# Patient Record
Sex: Male | Born: 1990 | Race: White | Hispanic: No | Marital: Single | Smoking: Current some day smoker
Health system: Southern US, Community
[De-identification: ages and names within clinical notes are randomized; demographics above are authoritative.]

---
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
## 2012-04-16 IMAGING — RF DG CHOLANGIOGRAM OPERATIVE
1 series · 4 of 4 positions shown · non-contrast
Comparison: Abdominal CT 01/22/2011.

CLINICAL DATA: Laparoscopic cholecystectomy.  Question retained
calculus.

INTRAOPERATIVE CHOLANGIOGRAM
TECHNIQUE: Cholangiographic images from the C-arm fluoroscopic
device were submitted for interpretation post-operatively.  Please
see the procedural report for the amount of contrast and the
fluoroscopy time utilized.

[Series 1: run · 4 of 71 frames shown]
[frame 11/71]
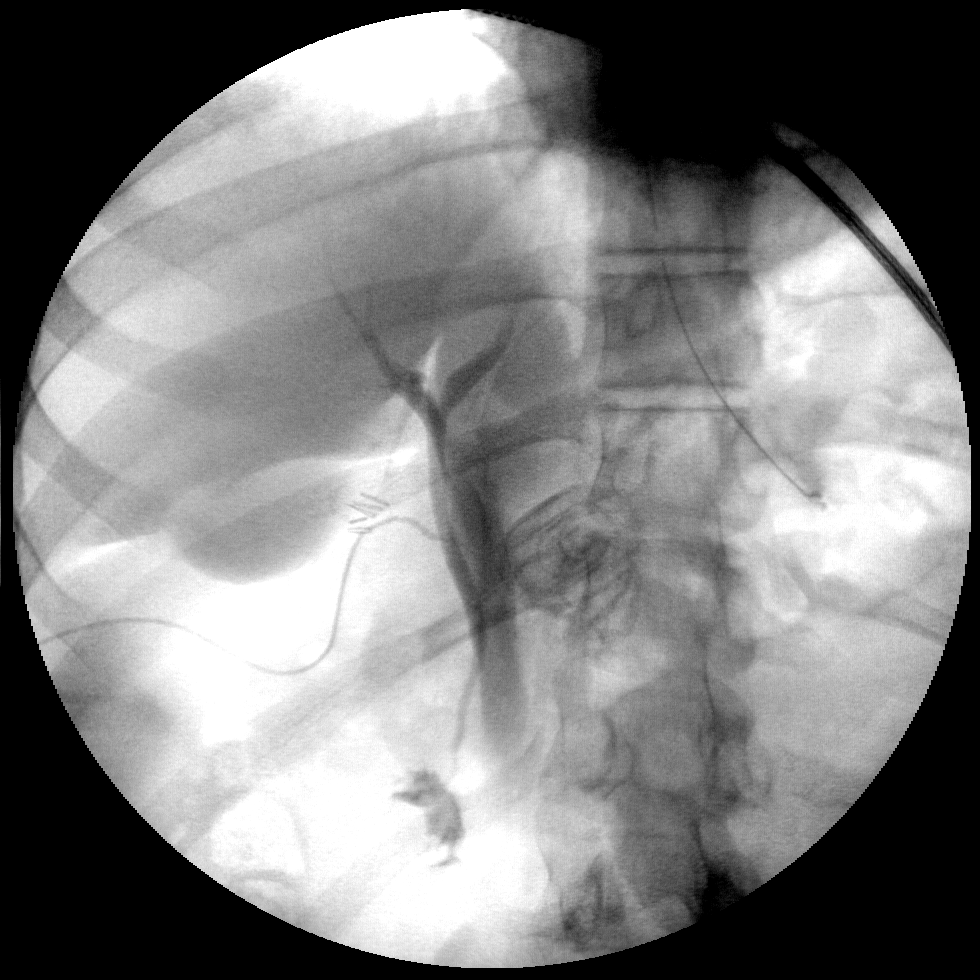
[frame 36/71]
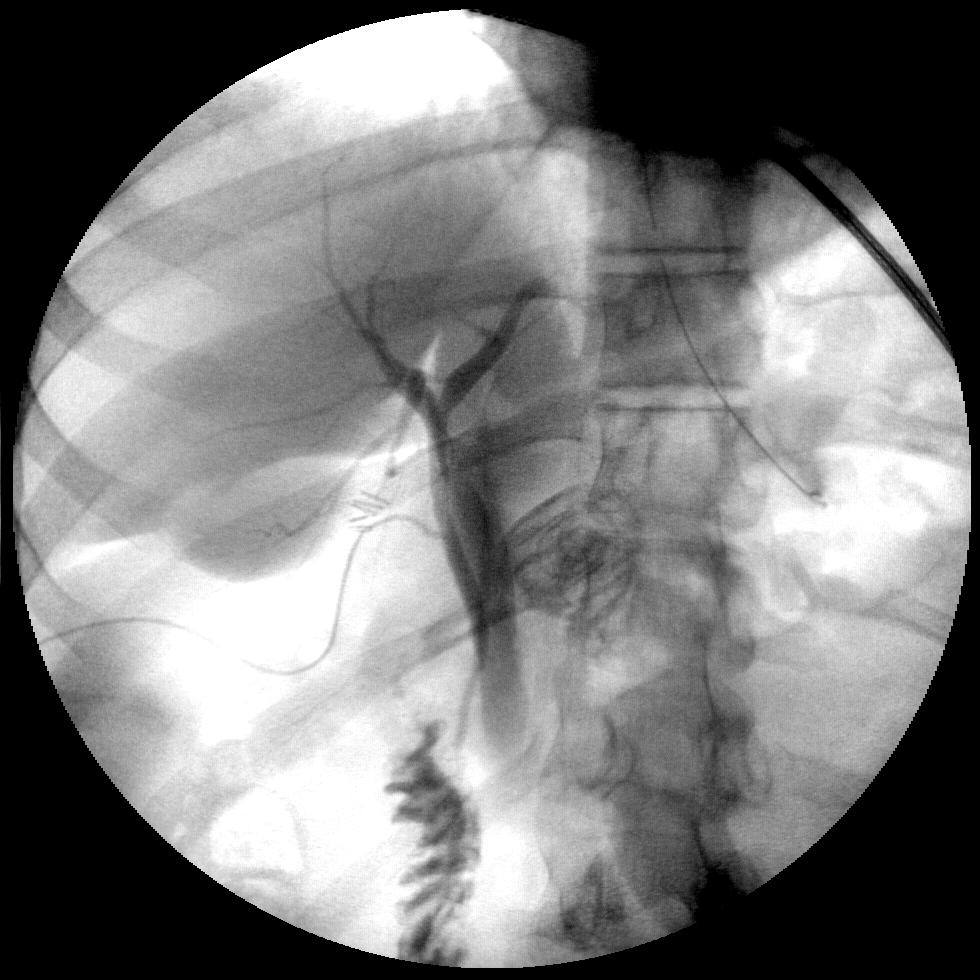
[frame 37/71]
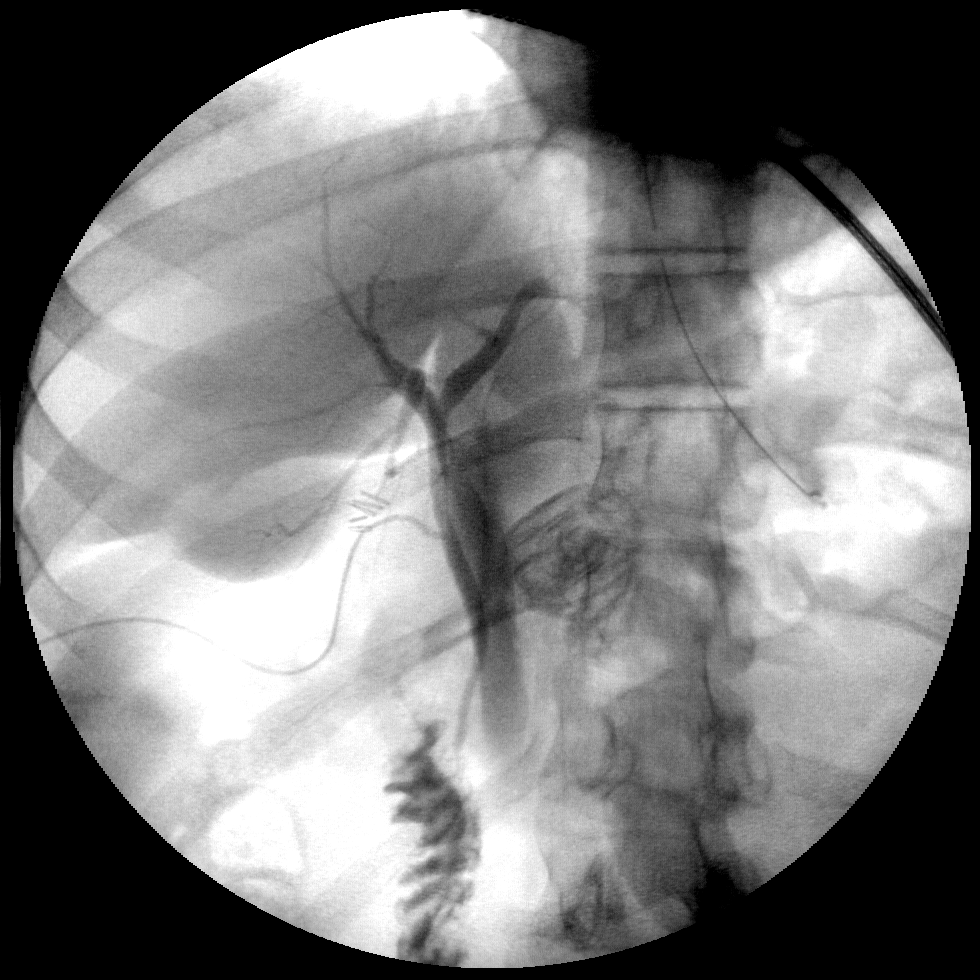
[frame 61/71]
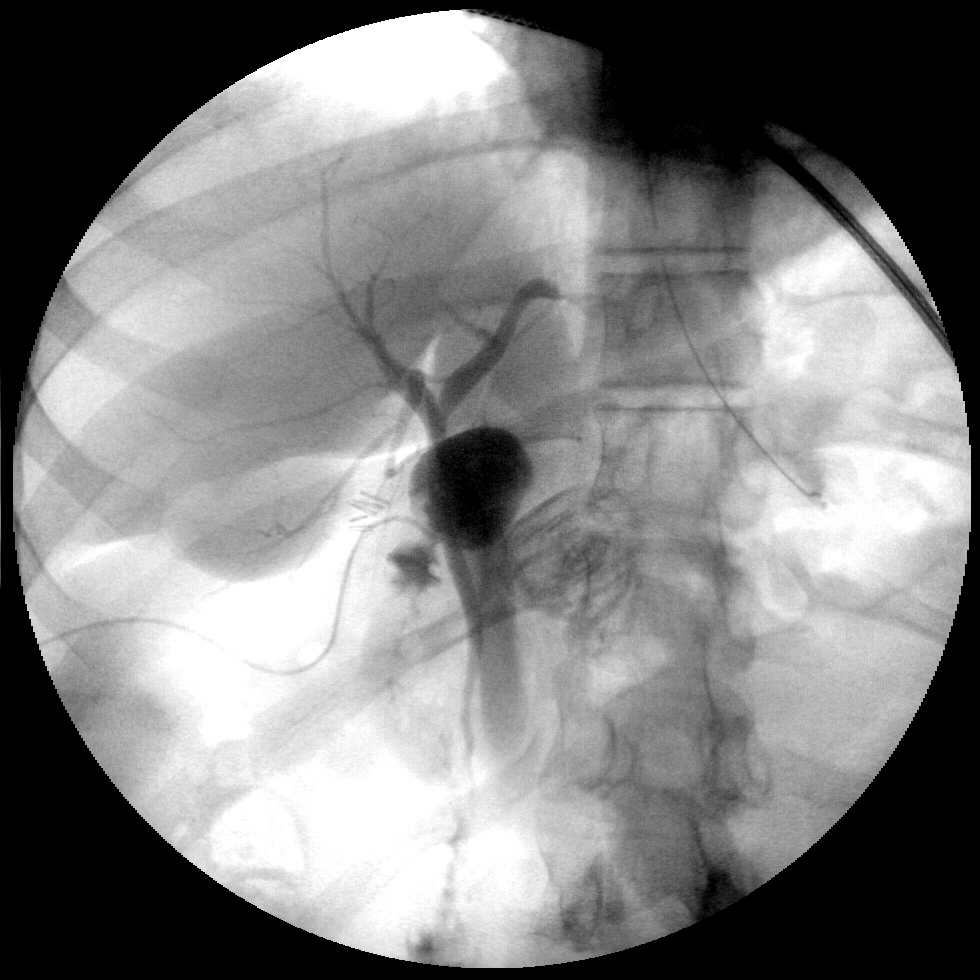

[4 of 4 positions shown; findings below may reference images not displayed]

FINDINGS: Initial images demonstrate vertical high density in the
right upper quadrant, overlying the common bile duct.  On injection
of the cystic duct remnant, the common bile duct is normally
opacified and appears normal in caliber.  This overlies the
preexisting density which does not change during this injection.
There is incomplete opacification of the intrahepatic biliary
system.  No extravasation or retained calculus is identified.
There is drainage into the duodenum.
IMPRESSION: 1.  No evidence of retained calculus, ductal obstruction or
definite extravasation.
2.  Vertical radiodensity in the right upper quadrant does not
change during the submitted injection. This may reflect leaked
contrast from a previous injection - correlate clinically.

## 2022-05-01 ENCOUNTER — Ambulatory Visit (INDEPENDENT_AMBULATORY_CARE_PROVIDER_SITE_OTHER): Payer: Medicare Other | Admitting: Psychiatry

## 2022-05-01 ENCOUNTER — Encounter (HOSPITAL_COMMUNITY): Payer: Self-pay | Admitting: Psychiatry

## 2022-05-01 VITALS — BP 126/84 | Ht 77.0 in | Wt 299.0 lb

## 2022-05-01 DIAGNOSIS — F3130 Bipolar disorder, current episode depressed, mild or moderate severity, unspecified: Secondary | ICD-10-CM

## 2022-05-01 DIAGNOSIS — F411 Generalized anxiety disorder: Secondary | ICD-10-CM | POA: Diagnosis not present

## 2022-05-01 DIAGNOSIS — F410 Panic disorder [episodic paroxysmal anxiety] without agoraphobia: Secondary | ICD-10-CM | POA: Diagnosis not present

## 2022-05-01 MED ORDER — CLONIDINE HCL 0.1 MG PO TABS: 0.1000 mg | ORAL_TABLET | Freq: Every day | ORAL | 1 refills | Status: DC

## 2022-05-01 MED ORDER — LURASIDONE HCL 40 MG PO TABS: ORAL_TABLET | ORAL | 1 refills | Status: DC

## 2022-05-01 NOTE — Progress Notes (Signed)
Psychiatric Initial Adult Assessment   Patient Identification: NASHID PELLUM MRN:  [id number] Date of Evaluation:  05/01/2022 Referral Source: primary care  Chief Complaint:   Chief Complaint  Patient presents with   Anxiety   Establish Care   Visit Diagnosis:    ICD-10-CM   1. Bipolar I disorder, most recent episode depressed (HCC)  F31.30     2. Panic attacks  F41.0     3. GAD (generalized anxiety disorder)  F41.1       History of Present Illness:  31 years old referred by primary care, [facility], now due to insurance reason has to change provider, on disability for bipolar, on multiple other meds before or tried, including caplyta, vraylar  Says has had depression in young age with extreme anxiety with panic attacks when going to school and later changed to home schooling starting 5th grade Has history of depression with crashing into tiredness, depressed mood, disturbed sleep apetite and diagnosed with depression could not function at work when tried different jobs and would get panic attacks  Xanax helps panic attacks cannot work Family is supportive Has history of manic symptoms in the past,irritable or  increase energy, hyped mood, spending money with distraction and racing thoughts with last 4  days or more then crash into depression  No side effects reported Has worries excessive in the past, seems worries are better with meds but gets panicky with around people, crowds  Stays at home  Feel balanced as of now with meds and home support uses 1 -2 beer 2 times a  week  No psychotic symptoms  Feels current meds keeping balance including lithium, lamictal, xanax, clonidine  Says clonidine helps with irritability and mood symptoms combine with other meds   Aggravating factor: finances, chronic mental health, anxiety Modifying factor: parents, walks  Duration since young age No past psych admission or  suicide attempt  Past Psychiatric History: depression, panic attacks,   Previous Psychotropic Medications: Yes  Caplyta, vraylar   Substance Abuse History in the last 12 months:  Yes.    Consequences of Substance Abuse: Takes beer over the weekend, discussed effects on depression and to abstain  Past Medical History: History reviewed. No pertinent past medical history. History reviewed. No pertinent surgical history.  Family Psychiatric History: GF brother, bipolar, sister possible bipolar  Family History: History reviewed. No pertinent family history.  Social History:   Social History   Socioeconomic History   Marital status: Single    Spouse name: Not on file   Number of children: Not on file   Years of education: Not on file   Highest education level: Not on file  Occupational History   Not on file  Tobacco Use   Smoking status: Never   Smokeless tobacco: Never  Vaping Use   Vaping Use: Some days   Substances: Nicotine, Flavoring  Substance and Sexual Activity   Alcohol use: Yes    Alcohol/week: 2.0 standard drinks of alcohol    Types: 2 Glasses of wine per week   Drug use: Never   Sexual activity: Yes  Other Topics Concern   Not on file  Social History Narrative   Not on file   Social Determinants of Health   Financial Resource Strain: Not on file  Food Insecurity: Not on file  Transportation Needs: Not on file  Physical Activity: Not on file  Stress: Not on file  Social Connections: Not  on file    Additional Social History: grew up with parents, 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 On disability for bipolar  Allergies:  No Known Allergies  Metabolic Disorder Labs: No results found for: "HGBA1C", "MPG" No results found for: "PROLACTIN" No results found for: "CHOL", "TRIG", "HDL", "CHOLHDL", "VLDL", "LDLCALC" No results found for: "TSH"  Therapeutic Level Labs: No results found for: "LITHIUM" No results found  for: "CBMZ" No results found for: "VALPROATE"  Current Medications: Current Outpatient Medications  Medication Sig Dispense Refill   albuterol (VENTOLIN HFA) 108 (90 Base) MCG/ACT inhaler SMARTSIG:1-2 Puff(s) By Mouth Every 6 Hours PRN     ALPRAZolam (XANAX) 1 MG tablet Take by mouth.     cloNIDine (CATAPRES) 0.1 MG tablet Take 0.1 mg by mouth at bedtime.     lamoTRIgine (LAMICTAL) 200 MG tablet TAKE 1 ORAL TABLET ONCE A DAY (IF OUT OF LAMICTAL MORE THAN 7 DAYS DO NOT RESTART CALL MD)     lithium 300 MG tablet Take by mouth.     lurasidone (LATUDA) 20 MG TABS tablet Take by mouth.     lurasidone (LATUDA) 40 MG TABS tablet TAKE 1 TABLET BY MOUTH EVERY DAY START IN 2 WEEKS WITH AT LEAST 350 CALORIE MEAL     meloxicam (MOBIC) 7.5 MG tablet Take by mouth.     metformin (FORTAMET) 1000 MG (OSM) 24 hr tablet TAKE 1 TABLET BY MOUTH EVERY DAY WITH A MEAL *MC NOT CALLED*     vitamin B-12 (CYANOCOBALAMIN) 500 MCG tablet Take 500 mcg by mouth daily.     No current facility-administered medications for this visit.    Psychiatric Specialty Exam: Review of Systems  Cardiovascular:  Negative for chest pain.  Psychiatric/Behavioral:  Negative for agitation and self-injury. The patient is nervous/anxious.     Blood pressure 126/84, height 6\' 5"  (1.956 m), weight 299 lb (135.6 kg).Body mass index is 35.46 kg/m.  General Appearance: Casual  Eye Contact:  Fair  Speech:  Clear and Coherent  Volume:  Decreased  Mood:   somewhat anxious  Affect:  Congruent  Thought Process:  Goal Directed  Orientation:  Full (Time, Place, and Person)  Thought Content:  Rumination  Suicidal Thoughts:  No  Homicidal Thoughts:  No  Memory:  Immediate;   Fair  Judgement:  Fair  Insight:  Shallow  Psychomotor Activity:  Decreased  Concentration:  Concentration: Fair  Recall:  of Knowledge:Fair  Language: Fair  Akathisia:   no involuntary movements  Handed:  Right  AIMS (if indicated):  no involuntary  movements  Assets:  Desire for Improvement Housing Social Support  ADL's:  Intact  Cognition: WNL  Sleep:  Fair   Screenings: PHQ2-9    Flowsheet Row [facility]/20[facility] [place]  PHQ-2 Total Score 2  PHQ-9 Total Score 13      Flowsheet Row Office Visit from 05/01/2022 in [facility] [place]  C-SSRS RISK CATEGORY No Risk       Assessment and Plan: as follows  Bipolar disorder depressed episode: doing fair with lithium, lamictal , clonidine for agitation. Also on latuda for bipolar  GAD: takes xanax , other meds name not give were tried, says different meds but xanax seems to keep help with anxiety and panic attacks  Panic attacks: says xanax hellps, other meds didn't help  Labs for lithium and TSH sent today to be done  Will also recommend counselling for anxiety, panic attacks  Collaboration of Care: Primary Care Provider AEB med note and referral reviewed  Patient/Guardian was advised Release of Information must be obtained prior to any record release in order to collaborate their care with an outside provider. Patient/Guardian was advised if they have not already done so to contact the registration department to sign all necessary forms in order for [place] to release information regarding their care.   Consent: Patient/Guardian gives verbal consent for treatment and assignment of benefits for services provided during this visit. Patient/Guardian expressed understanding and agreed to proceed.  FU 4 -5 weeks or earlier if needed Direct care time spent 45 min including chart review, documentation  Thresa Ross, MD 6/15/20232:08 PM

## 2022-05-02 LAB — TSH: TSH: 1.03 mIU/L (ref 0.40–4.50)

## 2022-05-02 LAB — LITHIUM LEVEL: Lithium Lvl: 0.5 mmol/L — ABNORMAL LOW (ref 0.6–1.2)

## 2022-05-23 ENCOUNTER — Other Ambulatory Visit (HOSPITAL_COMMUNITY): Payer: Self-pay | Admitting: Psychiatry

## 2022-06-10 ENCOUNTER — Ambulatory Visit (INDEPENDENT_AMBULATORY_CARE_PROVIDER_SITE_OTHER): Payer: Medicare Other | Admitting: Licensed Clinical Social Worker

## 2022-06-10 DIAGNOSIS — F410 Panic disorder [episodic paroxysmal anxiety] without agoraphobia: Secondary | ICD-10-CM | POA: Diagnosis not present

## 2022-06-10 DIAGNOSIS — F411 Generalized anxiety disorder: Secondary | ICD-10-CM | POA: Diagnosis not present

## 2022-06-10 DIAGNOSIS — F3130 Bipolar disorder, current episode depressed, mild or moderate severity, unspecified: Secondary | ICD-10-CM | POA: Diagnosis not present

## 2022-06-10 NOTE — Progress Notes (Signed)
Dad in session with patient to help provide collateral information for assessment   Comprehensive Clinical Assessment (CCA) Note  06/10/2022 [name] [id number]  Chief Complaint:  Chief Complaint  Patient presents with   Depression   Panic Attack   self-esteem   Anger   Visit Diagnosis: Bipolar disorder most recent episode depressed generalized anxiety disorder, panic attacks   CCA Biopsychosocial Intake/Chief Complaint:  Dad says he is on the autism spectrum, bipolar depressed, struggles a lot with depression. Currently along with depression he struggles with anger-there are things that trigger his anger that cause him to boil over internally. Dad can tell by talking to him angry does a good job controlling physically.  Harbors animosity toward people in his past he was bullied as a child. Family members that brought him out of his comfort zone ridiculed him where he being in Asperger's realm people talk things literally harbors hard feeling toward family members that make him feel uncomfortable. Dad doesn't know what his triggers are. Destroys his afternoon or day. Patient says wants to work on self-esteem. Last therapist Dr. Tennis Ship made him feel self-conscious about his weight. [facility] [address] in [place]  Current Symptoms/Problems: depression, anger,   Patient Reported Schizophrenia/Schizoaffective Diagnosis in Past: No   Strengths: kind person  Preferences: see above  Abilities: goes for walks watch sports likes to work on cars, playing sports   Type of Services Patient Feels are Needed: therapy, med management   Initial Clinical Notes/Concerns: Treatment history--patient diagnosed with bipolar disorder most recent episode depressed, panic attacks, generalized anxiety disorder. When patient turned 20 went to PCP "threw his hands up when kept coming back for depression" a couple attempts at employment panic attacks during the orientation process.  Put him on anti-depressant and mood stabilize [facility] started mental health treatment. He was under the care NP worked under the psychiatrist. He had 5 different therapists big on [name] being an independent thinking. He misinterpreted almost like a bit of rebellious state. One therapist who focused on when anxious when felt everything elevated taught him some breathing techniques served him well. Not a lot of productive therapy per Dad's opinion.  Went there for quite a while 9 or 10 years. Big reason here now Dad was forced to retire insurance transfer couldn't go to mood treatment center. Patient approved for disability he went on Medicaid.  After mood treatment center went to [facility]. Patient diagnosed with autism in 2019. Depression-long-term depression. Medical issues-he struggles with prediabetic has gained 100 lbs from propanolol. Family history-pat GF uncle-alcoholics extended family of wife uncle's were alcoholics. MH prevalent both sides of the family. pat great uncle and aunt, his great grandmother on father's side. Patient has sister diagnosed with bipolar Dad said don't know a lot about it she is high functioning. Wife's side 3 sisters including with wife she struggles with depression. They were all undiagnosed had a sister institutionalized briefly. She was addicted to drugs. great aunt-paternal major mental health a lot of depression, not being able to function won't leave the house.   Mental Health Symptoms Depression:   Change in energy/activity; Fatigue; Difficulty Concentrating; Hopelessness; Sleep (too much or little); Increase/decrease in appetite; Irritability; Tearfulness; Worthlessness (struggle with sleeping.  Loss of appetite sometimes skips breakfast and lunch. Weight fluctuates)   Duration of Depressive symptoms:  Greater than two weeks   Mania:   Irritability; Change in energy/activity; Recklessness; Racing thoughts; Increased Energy (Spending more  money,  last for 4 days then crashes into depression)   Anxiety:    Worrying; Difficulty concentrating; Fatigue; Irritability; Sleep; Tension; Restlessness (panic-break up in sweats tremors in hands can't speak clearly. Driving and pull off and tell Dad need for him to drive. At family function Dad can see him becoming disengaged go into a different room and tell Dad can't do this.)   Psychosis:   None   Duration of Psychotic symptoms: No data recorded  Trauma:   None   Obsessions:   -- (at times he can mess up a room with trash collects in room.)   Compulsions:   None   Inattention:   None   Hyperactivity/Impulsivity:   None   Oppositional/Defiant Behaviors:   None   Emotional Irregularity:   None   Other Mood/Personality Symptoms:  No data recorded   Mental Status Exam Appearance and self-care  Stature:   Tall   Weight:   Overweight   Clothing:   Casual   Grooming:   Normal   Cosmetic use:   None   Posture/gait:   Normal   Motor activity:   Not Remarkable   Sensorium  Attention:   Normal   Concentration:   Normal   Orientation:   X5   Recall/memory:   Normal   Affect and Mood  Affect:   Anxious   Mood:   Angry; Anxious; Euthymic; Euphoric   Relating  Eye contact:   Normal   Facial expression:   Responsive   Attitude toward examiner:   Cooperative   Thought and Language  Speech flow:  Normal   Thought content:   Appropriate to Mood and Circumstances   Preoccupation:   None   Hallucinations:   None   Organization:  No data recorded  [employer] of Knowledge:   Average   Intelligence:   Average   Abstraction:   Normal   Judgement:   Fair   Dance movement psychotherapist:   Realistic   Insight:   Fair   Decision Making:   Impulsive (manic triggered by depression, capable of making rational decisions Dad comfortable leaving him house couple of days would function fine if not depressed.)   Social  Functioning  Social Maturity:   Isolates (lives with parents. Dad retired Event organiser for [employer], mom retired Disabled work for [employer])   Social Judgement:   Normal   Stress  Stressors:   Surveyor, quantity (excellent driver but fear of accident forefront of mind. Doesn't like to be in car that doesn't have dashcam. Being in crowds sometimes panic if at restaurant, movie theater, family gathering.)   Coping Ability:   Overwhelmed   Skill Deficits:   -- (wants to work on self-esteem, for Dad controlling anger Dad doesn't like seeing him become nonfunctional if a notion comes up from the past so learning to control the anger)   Supports:   Family     Religion: Religion/Spirituality Are You A Religious Person?: Yes What is Your Religious Affiliation?: Baptist How Might This Affect Treatment?: n/a  Leisure/Recreation: Leisure / Recreation Do You Have Hobbies?: Yes Leisure and Hobbies: see above  Exercise/Diet: Exercise/Diet Do You Exercise?: Yes What Type of Exercise Do You Do?: Run/Walk How Many Times a Week Do You Exercise?: 1-3 times a week Have You Gained or Lost A Significant Amount of Weight in the Past Six Months?:  (fluctuates) Do You Follow a Special Diet?: No Do You Have Any Trouble Sleeping?: Yes Explanation of Sleeping Difficulties:  Mainly struggle with getting to sleep   CCA Employment/Education Employment/Work Situation: Employment / Work Situation Employment Situation: On disability Why is Patient on Disability: bipolar, autism spectrum made it to where (tried to work) he can't past the orientation of employment the anxiousness How Long has Patient Been on Disability: April 2003 Patient's Job has Been Impacted by Current Illness:  (n/a) What is the Longest Time Patient has Held a Job?: n/a Has Patient ever Been in the 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 Put him in main stream. Decided because of that best interest home school-) Patient's Education Has Been Impacted by Current Illness:  (cont-difficulty in school-not getting help from school system so decided to home school also received speech therapy)   CCA Family/Childhood History Family and Relationship History: Family history Marital status: Single Are you sexually active?: No What is your sexual orientation?: woman Has your sexual activity been affected by drugs, alcohol, medication, or emotional stress?: n/a Does patient have children?: No  Childhood History:  Childhood History By whom was/is the patient raised?: Both parents Additional childhood history information: a lot is blocked out by him. He was bullied. He was home schooled from fifth grade most related to anxiety. Description of patient's relationship with caregiver when they were a child: Dad says he was a dream to be around can't imagine having a better child until treated for things 4-5 months into treatment combination of medications and the therapy confusing him a bit. Being rebellious overstated testing boundaries. Never went through rebellious teen stage in contrast to sister. Patient's description of current relationship with people who raised him/her: get along issues mentioned gets involved when he is withdrawn. He gets angry and gets done for the day he has learned emotional awareness and learned not in a good head place and stays in room for hours. Wife says need to check on  him. Dad says knows not ok and wants time to himself. Sometimes mad and wants to put distance between parents sometimes a way of coping with life in general How were you disciplined when you got in trouble as a child/adolescent?: n/a Does patient have siblings?: Yes Number of Siblings: 1 Description of patient's current relationship with siblings: Kerin Salen sister-32 they go back and forth in how close they he always feels a closeness to her a protective type thing doesn't get along with brother-in-law puts a little distance in the relationship Did patient suffer any verbal/emotional/physical/sexual abuse as a child?: No Did patient suffer from severe childhood neglect?: No Has patient ever been sexually abused/assaulted/raped as an adolescent or adult?: No Was the patient ever a victim of a crime or a disaster?: No Witnessed domestic violence?: No Has patient been affected by domestic violence as an adult?: No  Child/Adolescent Assessment: n/a     CCA Substance Use Alcohol/Drug Use: Alcohol / Drug Use Pain Medications: n/a Prescriptions: see MAR Over the Counter: see MAR History of alcohol / drug use?: No history  of alcohol / drug abuse                         ASAM's:  Six Dimensions of Multidimensional Assessment  Dimension 1:  Acute Intoxication and/or Withdrawal Potential:      Dimension 2:  Biomedical Conditions and Complications:      Dimension 3:  Emotional, Behavioral, or Cognitive Conditions and Complications:     Dimension 4:  Readiness to Change:     Dimension 5:  Relapse, Continued use, or Continued Problem Potential:     Dimension 6:  Recovery/Living Environment:     ASAM Severity Score:    ASAM Recommended Level of Treatment:     Substance use Disorder (SUD)-n/a    Recommendations for Services/Supports/Treatments: Recommendations for Services/Supports/Treatments Recommendations For Services/Supports/Treatments: Medication Management, Individual  Therapy  DSM5 Diagnoses: Patient Active Problem List   Diagnosis Date Noted   RUQ PAIN 01/12/2009   GYNECOMASTIA 01/09/2009   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   FLANK PAIN 01/09/2009   EYE PAIN, RIGHT 07/28/2008   TINEA CRURIS 12/16/2007    Patient Centered Plan: Patient is on the following Treatment Plan(s):  Anxiety, Depression, and Low Self-Esteem, anger treatment plan completed at next treatment session   Referrals to Alternative Service(s): Referred to Alternative Service(s):   Place:   Date:   Time:    Referred to Alternative Service(s):   Place:   Date:   Time:    Referred to Alternative Service(s):   Place:   Date:   Time:    Referred to Alternative Service(s):   Place:   Date:   Time:      Collaboration of Care: Medication Management AEB review of Dr. Gilmore Laroche note  Patient/Guardian was advised Release of Information must be obtained prior to any record release in order to collaborate their care with an outside provider. Patient/Guardian was advised if they have not already done so to contact the registration department to sign all necessary forms in order for [place] to release information regarding their care.   Consent: Patient/Guardian gives verbal consent for treatment and assignment of benefits for services provided during this visit. Patient/Guardian expressed understanding and agreed to proceed.   Coolidge Breeze, LCSW

## 2022-06-12 ENCOUNTER — Encounter (HOSPITAL_COMMUNITY): Payer: Self-pay | Admitting: Psychiatry

## 2022-06-12 ENCOUNTER — Ambulatory Visit (INDEPENDENT_AMBULATORY_CARE_PROVIDER_SITE_OTHER): Payer: Medicare Other | Admitting: Psychiatry

## 2022-06-12 VITALS — BP 120/82 | Temp 98.5°F | Ht 77.0 in | Wt 298.0 lb

## 2022-06-12 DIAGNOSIS — F3130 Bipolar disorder, current episode depressed, mild or moderate severity, unspecified: Secondary | ICD-10-CM

## 2022-06-12 DIAGNOSIS — F410 Panic disorder [episodic paroxysmal anxiety] without agoraphobia: Secondary | ICD-10-CM | POA: Diagnosis not present

## 2022-06-12 DIAGNOSIS — F411 Generalized anxiety disorder: Secondary | ICD-10-CM | POA: Diagnosis not present

## 2022-06-12 MED ORDER — LAMOTRIGINE 200 MG PO TABS: ORAL_TABLET | ORAL | 1 refills | Status: DC

## 2022-06-12 MED ORDER — ALPRAZOLAM 0.5 MG PO TABS: 0.5000 mg | ORAL_TABLET | Freq: Every day | ORAL | 0 refills | Status: DC | PRN

## 2022-06-12 MED ORDER — LITHIUM CARBONATE 300 MG PO TABS: 900.0000 mg | ORAL_TABLET | Freq: Every evening | ORAL | 2 refills | Status: DC

## 2022-06-12 NOTE — Progress Notes (Signed)
[facility] Follow up visit  Patient Identification: [name] MRN:  [id number] Date of Evaluation:  06/12/2022 Referral Source: primary care  Chief Complaint:   Chief Complaint  Patient presents with   Follow-up   Anxiety   Visit Diagnosis:    ICD-10-CM   1. Bipolar I disorder, most recent episode depressed (HCC)  F31.30     2. Panic attacks  F41.0     3. GAD (generalized anxiety disorder)  F41.1       History of Present Illness:  31 years old referred by primary care, has been getting treatment at [facility] for bipolar and anxiety, now due to insurance reason has to change provider, on disability for bipolar, on multiple other meds before or tried, including caplyta, vraylar   Doing fait on meds, have stopped catapress. Xanax prn helps with anxiety and panic symptoms Mood fair on mood stablizers Lithium level is less then 0.5 TSH wnl  reviewed  No rash  Aggravating factor:finances, chronic mental health,  Modifying factor: parents, walks  Duration since young age No past psych admission or suicide attempt  Past Psychiatric History: depression, panic attacks,   Previous Psychotropic Medications: Yes  Caplyta, vraylar   Substance Abuse History in the last 12 months:  Yes.    Consequences of Substance Abuse: Takes beer over the weekend, discussed effects on depression and to abstain  Past Medical History: No past medical history on file. No past surgical history on file.  Family Psychiatric History: GF brother, bipolar, sister possible bipolar  Family History: No family history on file.  Social History:   Social History   Socioeconomic History   Marital status: Single    Spouse name: Not on file   Number of children: Not on file   Years of education: Not on file   Highest education level: Not on file  Occupational History   Not on file  Tobacco Use   Smoking status: Never   Smokeless tobacco: Never  Vaping Use   Vaping Use: Some days    Substances: Nicotine, Flavoring  Substance and Sexual Activity   Alcohol use: Yes    Alcohol/week: 2.0 standard drinks of alcohol    Types: 2 Glasses of wine per week   Drug use: Never   Sexual activity: Yes  Other Topics Concern   Not on file  Social History Narrative   Not on file   Social Determinants of Health   Financial Resource Strain: Not on file  Food Insecurity: Not on file  Transportation Needs: Not on file  Physical Activity: Not on file  Stress: Not on file  Social Connections: Not on file    Additional Social History: grew up with parents, 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 On disability for bipolar  Allergies:  No Known Allergies  Metabolic Disorder Labs: No results found for: "HGBA1C", "MPG" No results found for: "PROLACTIN" No results found for: "CHOL", "TRIG", "HDL", "CHOLHDL", "VLDL", "LDLCALC" Lab Results  Component Value Date   TSH 1.03 05/01/2022    Therapeutic Level Labs: Lab Results  Component Value Date   LITHIUM 0.5 (L) 05/01/2022   No results found for: "CBMZ" No results found for: "VALPROATE"  Current Medications: Current Outpatient Medications  Medication Sig Dispense Refill   albuterol (VENTOLIN HFA) 108 (90 Base) MCG/ACT inhaler SMARTSIG:1-2 Puff(s) By Mouth Every 6 Hours PRN     lurasidone (LATUDA) 40 MG TABS tablet TAKE 1 TABLET BY MOUTH EVERY DAY START  IN 2 WEEKS WITH AT LEAST 350 CALORIE MEAL 30 tablet 1   meloxicam (MOBIC) 7.5 MG tablet Take by mouth.     metformin (FORTAMET) 1000 MG (OSM) 24 hr tablet TAKE 1 TABLET BY MOUTH EVERY DAY WITH A MEAL *MC NOT CALLED*     vitamin B-12 (CYANOCOBALAMIN) 500 MCG tablet Take 500 mcg by mouth daily.     ALPRAZolam (XANAX) 0.5 MG tablet Take 1 tablet (0.5 mg total) by mouth daily as needed for anxiety. 30 tablet 0   lamoTRIgine (LAMICTAL) 200 MG tablet TAKE 1 ORAL TABLET ONCE A DAY (IF OUT OF LAMICTAL MORE THAN 7 DAYS DO NOT RESTART CALL MD) 30 tablet 1    lithium 300 MG tablet Take 3 tablets (900 mg total) by mouth at bedtime. 90 tablet 2   No current facility-administered medications for this visit.    Psychiatric Specialty Exam: Review of Systems  Cardiovascular:  Negative for chest pain.  Neurological:  Negative for tremors.  Psychiatric/Behavioral:  Negative for agitation and self-injury. The patient is nervous/anxious.     Blood pressure 120/82, temperature 98.5 F (36.9 C), height 6\' 5"  (1.956 m), weight 298 lb (135.2 kg).Body mass index is 35.34 kg/m.  General Appearance: Casual  Eye Contact:  Fair  Speech:  Clear and Coherent  Volume:  Decreased  Mood:  somewhat anxious  Affect:  Congruent  Thought Process:  Goal Directed  Orientation:  Full (Time, Place, and Person)  Thought Content:  Rumination  Suicidal Thoughts:  No  Homicidal Thoughts:  No  Memory:  Immediate;   Fair  Judgement:  Fair  Insight:  Shallow  Psychomotor Activity:  Decreased  Concentration:  Concentration: Fair  Recall:  of Knowledge:Fair  Language: Fair  Akathisia:   no involuntary movements  Handed:  Right  AIMS (if indicated):  no involuntary movements  Assets:  Desire for Improvement Housing Social Support  ADL's:  Intact  Cognition: WNL  Sleep:  Fair   Screenings: [employer] from 06/10/2022 in [facility] AT [place] Office Visit from 05/01/2022 in [facility] AT [place]  PHQ-2 Total Score 2 2  PHQ-9 Total Score 15 13      Flowsheet Row Office Visit from 06/12/2022 in [facility] AT [place] Counselor from 06/10/2022 in [facility] AT [place] Office Visit from 05/01/2022 in [facility] AT [place]  C-SSRS RISK CATEGORY Error: Q3, 4, or 5 should not be populated when Q2 is No Error: Q3, 4, or 5 should not be populated when Q2 is No No Risk       Assessment and  Plan: as follows  Prior documentation reviewed  Bipolar disorder depressed episode: doing fair continue lamictal, lithium, latuda Labs reviewed lithium level below 0.5   GAD: fluctuates work on breathing therapy continue xanax prn  Panic attacks:  sporadic, work on breathing techniques, and cotninue small dose prn  Fu 2 -17m Meds due were renewed Direct care time spent in office including face to face 20 minutes  1m, MD 7/27/202310:18 AM

## 2022-06-16 ENCOUNTER — Telehealth (HOSPITAL_COMMUNITY): Payer: Self-pay

## 2022-06-16 NOTE — Telephone Encounter (Signed)
[facility] sent a fax requesting a refill on Clonidine 0.1mg . Did you discontinue this medication, or do you want to refill it? It looks like he is stable on it in your note but in the medications it looks like you discontinued it. Please Petra Kuba

## 2022-06-16 NOTE — Telephone Encounter (Signed)
Ok, I will inform the pharmacy

## 2022-07-06 ENCOUNTER — Other Ambulatory Visit (HOSPITAL_COMMUNITY): Payer: Self-pay | Admitting: Psychiatry

## 2022-07-08 ENCOUNTER — Other Ambulatory Visit (HOSPITAL_COMMUNITY): Payer: Self-pay | Admitting: Psychiatry

## 2022-07-10 ENCOUNTER — Ambulatory Visit (HOSPITAL_COMMUNITY): Payer: Medicare Other | Admitting: Licensed Clinical Social Worker

## 2022-07-17 ENCOUNTER — Telehealth (HOSPITAL_COMMUNITY): Payer: Self-pay | Admitting: Psychiatry

## 2022-07-17 ENCOUNTER — Ambulatory Visit (HOSPITAL_COMMUNITY): Payer: Medicare Other | Admitting: Licensed Clinical Social Worker

## 2022-07-17 NOTE — Telephone Encounter (Signed)
Jury duty excuse letter written.

## 2022-07-23 ENCOUNTER — Other Ambulatory Visit (HOSPITAL_COMMUNITY): Payer: Self-pay | Admitting: *Deleted

## 2022-07-23 ENCOUNTER — Telehealth (HOSPITAL_COMMUNITY): Payer: Self-pay | Admitting: *Deleted

## 2022-07-23 MED ORDER — CLONIDINE HCL 0.1 MG PO TABS: 0.1000 mg | ORAL_TABLET | Freq: Every day | ORAL | 1 refills | Status: DC

## 2022-07-23 NOTE — Telephone Encounter (Signed)
[facility] -- cloNIDine (CATAPRES) 0.1 MG tablet  Last Fill Date: 06/24/22

## 2022-07-23 NOTE — Telephone Encounter (Signed)
Next Appt 09/11/82

## 2022-07-23 NOTE — Telephone Encounter (Signed)
Rx NOTIFIED

## 2022-08-19 ENCOUNTER — Ambulatory Visit (HOSPITAL_COMMUNITY): Payer: Medicare Other | Admitting: Licensed Clinical Social Worker

## 2022-09-08 ENCOUNTER — Ambulatory Visit (INDEPENDENT_AMBULATORY_CARE_PROVIDER_SITE_OTHER): Payer: Medicare Other | Admitting: Licensed Clinical Social Worker

## 2022-09-08 ENCOUNTER — Encounter (HOSPITAL_COMMUNITY): Payer: Self-pay

## 2022-09-08 DIAGNOSIS — F410 Panic disorder [episodic paroxysmal anxiety] without agoraphobia: Secondary | ICD-10-CM

## 2022-09-08 DIAGNOSIS — F411 Generalized anxiety disorder: Secondary | ICD-10-CM | POA: Diagnosis not present

## 2022-09-08 DIAGNOSIS — F3130 Bipolar disorder, current episode depressed, mild or moderate severity, unspecified: Secondary | ICD-10-CM | POA: Diagnosis not present

## 2022-09-08 NOTE — Progress Notes (Signed)
THERAPIST PROGRESS NOTE  Session Time: 2:00 PM to 2:45 PM  Participation Level: Active  Behavioral Response: CasualAlert appropriate  Type of Therapy: Individual Therapy  Treatment Goals addressed: Work on self-esteem, anxiety, depression, anger management, coping  ProgressTowards Goals: Initial  Interventions: Solution Focused, Strength-based, Supportive, and Other: Strengthening self-esteem  Summary: [name] is a 31 y.o. male who presents with patient said disability worker wants him to do therapy with med management.  Patient thinks will help last couple months he struggled with self-esteem.  Reviewed how he has been things he has been doing gets out to help his father deliver groceries.  Likes to get out previous therapist encouraged him to be independent this is a way for him to work on that.  Did he also was helping people who cannot get out in the community to get groceries.  He also helps his mom who has health issues. 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  Reviewed beginning of handout on self-esteem titled "8 steps to improving your self-esteem" therapist utilized handout to reinforce we all have unconditional worth.  Noted patient can be his worst critic and talked about being more mindful recognizing and becoming aware of negative self talk as we become more aware we begin to distance her cells from the feelings that brings on.  Without this awareness we can easily fall into the trap of believing are self-limiting talk and it is 1 expert says do not believe everything you think thoughts are just that thoughts not facts.  Talked about changing the story we will have a narrative her story we created about ourselves that shapes are off perceptions upon which are core self images base if we want to change the story we have to understand where it came from and where we receive the messages we tell ourselves whose voices are we internalizing noted for patient being  picked on for being heavy, previous therapist who picked on him for this.  Talked about one of his goals is to lose weight talked about tracking is a helpful way to do this tracking as well as exercise and water intake. Patient says this has been a goal for him for years to lose weight.  Does regular walking and actually cold weather is better as he is body can get overheated.  Also talked about how negative thoughts can be repeated so many times he start to believe there truth.  Some thoughts are learn which means that they can be on learned.  What does he wish he believes about himself repeat these phrases every day.  It has been demonstrated that writing down positive affirmations can lessen symptoms of depression and that large number of written positive statements are correlated with greater improvement. to lose weight for years.  Completed treatment plan and patient gave consent to complete in session verbally.  Therapist also mentioned panic and working on this as well think he is breathing slower noting it is a false alarm that nothing is happening that is dangerous and patient seems to be aware of this.  Therapist provided space and support for patient to talk about thoughts and feelings in session Suicidal/Homicidal: No  Plan: Return again in 2 weeks.2.  Continue with self-esteem workbook look at resources such as anxiety book, mood book work on panic  Diagnosis: Bipolar disorder most recent episode depressed, Annick attacks, analyzed anxiety disorder  Collaboration of Care: Medication Management AEB review of Dr. Gilmore Laroche last note  Patient/Guardian was  advised Release of Information must be obtained prior to any record release in order to collaborate their care with an outside provider. Patient/Guardian was advised if they have not already done so to contact the registration department to sign all necessary forms in order for [place] to release information regarding their care.   Consent:  Patient/Guardian gives verbal consent for treatment and assignment of benefits for services provided during this visit. Patient/Guardian expressed understanding and agreed to proceed.   Cordella Register, LCSW 09/08/2022

## 2022-09-08 NOTE — Plan of Care (Signed)
  Problem: Depression CCP Problem  1  Goal:  improve self-esteem Outcome: Not Progressing Goal: decrease anxiety Description: Patient feels like it slowed down past 2-3 months. Comes out of blue hyperventilating, can't control breathing.  Outcome: Not Progressing Goal: anger management Description: Family situations can be a trigger Outcome: Not Progressing Goal: LTG: Reduce frequency, intensity, and duration of depression symptoms as evidenced by: SSB input needed on appropriate metric Outcome: Not Progressing Goal: STG: [name] WILL IDENTIFY as needed COGNITIVE PATTERNS AND BELIEFS THAT SUPPORT DEPRESSION Outcome: Not Progressing

## 2022-09-11 ENCOUNTER — Encounter (HOSPITAL_COMMUNITY): Payer: Self-pay | Admitting: Psychiatry

## 2022-09-11 ENCOUNTER — Ambulatory Visit (INDEPENDENT_AMBULATORY_CARE_PROVIDER_SITE_OTHER): Payer: Medicare Other | Admitting: Psychiatry

## 2022-09-11 VITALS — BP 128/84 | HR 84 | Ht 77.0 in | Wt 304.0 lb

## 2022-09-11 DIAGNOSIS — F3130 Bipolar disorder, current episode depressed, mild or moderate severity, unspecified: Secondary | ICD-10-CM | POA: Diagnosis not present

## 2022-09-11 DIAGNOSIS — F410 Panic disorder [episodic paroxysmal anxiety] without agoraphobia: Secondary | ICD-10-CM

## 2022-09-11 DIAGNOSIS — F411 Generalized anxiety disorder: Secondary | ICD-10-CM | POA: Diagnosis not present

## 2022-09-11 NOTE — Progress Notes (Signed)
[place] Follow up visit  Patient Identification: [name] MRN:  [id number] Date of Evaluation:  09/11/2022 Referral Source: primary care  Chief Complaint:   No chief complaint on file. Follow up mood symptoms, bipolar review Visit Diagnosis:    ICD-10-CM   1. Bipolar I disorder, most recent episode depressed ([place])  F31.30     2. Panic attacks  F41.0     3. GAD (generalized anxiety disorder)  F41.1       History of Present Illness:  31 years old referred by primary care, [facility], now due to insurance reason has to change provider, on disability for bipolar, on multiple other meds before or tried, including caplyta, vraylar  Doing fair, spending time outside in the sun, depression better  Still worries, more so related to world events Denies worsening of panic attacks Xanax prn helps with that and is in therapy No rash   Aggravating factor:finances, world events, chronic mental health,  Modifying factor: parents,walks Severity better, sleeping better Duration since young age No past psych admission or suicide attempt  Past Psychiatric History: depression, panic attacks,   Previous Psychotropic Medications: Yes  Caplyta, vraylar   Substance Abuse History in the last 12 months:  Yes.    Consequences of Substance Abuse: Takes beer over the weekend, discussed effects on depression and to abstain  Past Medical History: History reviewed. No pertinent past medical history. History reviewed. No pertinent surgical history.  Family Psychiatric History: GF brother, bipolar, sister possible bipolar  Family History: History reviewed. No pertinent family history.  Social History:   Social History   Socioeconomic History   Marital status: Single    Spouse name: Not on file   Number of children: Not on file   Years of education: Not on file   Highest education level: Not on file  Occupational History   Not on  file  Tobacco Use   Smoking status: Never   Smokeless tobacco: Never  Vaping Use   Vaping Use: Some days   Substances: Nicotine, Flavoring  Substance and Sexual Activity   Alcohol use: Yes    Alcohol/week: 2.0 standard drinks of alcohol    Types: 2 Glasses of wine per week   Drug use: Never   Sexual activity: Yes  Other Topics Concern   Not on file  Social History Narrative   Not on file   Social Determinants of Health   Financial Resource Strain: Not on file  Food Insecurity: Not on file  Transportation Needs: Not on file  Physical Activity: Not on file  Stress: Not on file  Social Connections: Not on file    Additional Social History: grew up with parents, 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 On disability for bipolar  Allergies:  No Known Allergies  Metabolic Disorder Labs: No results found for: "HGBA1C", "MPG" No results found for: "PROLACTIN" No results found for: "CHOL", "TRIG", "HDL", "CHOLHDL", "VLDL", "LDLCALC" Lab Results  Component Value Date   TSH 1.03 05/01/2022    Therapeutic Level Labs: Lab Results  Component Value Date   LITHIUM 0.5 (L) 05/01/2022   No results found for: "CBMZ" No results found for: "VALPROATE"  Current Medications: Current Outpatient Medications  Medication Sig Dispense Refill   albuterol (VENTOLIN HFA) 108 (90 Base) MCG/ACT inhaler SMARTSIG:1-2 Puff(s) By Mouth Every 6 Hours PRN     ALPRAZolam (XANAX) 0.5 MG tablet Take 1 tablet (0.5  mg total) by mouth daily as needed for anxiety. 30 tablet 0   cloNIDine (CATAPRES) 0.1 MG tablet Take 1 tablet (0.1 mg total) by mouth daily. 30 tablet 1   lamoTRIgine (LAMICTAL) 200 MG tablet TAKE 1 ORAL TABLET ONCE A DAY (IF OUT OF LAMICTAL MORE THAN 7 DAYS DO NOT RESTART CALL MD) 30 tablet 1   lithium 300 MG tablet TAKE 3 TABLETS (900 MG TOTAL) BY MOUTH AT BEDTIME. 270 tablet 1   lurasidone (LATUDA) 40 MG TABS tablet TAKE 1 TABLET BY MOUTH EVERY DAY START IN 2  WEEKS WITH AT LEAST 350 CALORIE MEAL 30 tablet 1   meloxicam (MOBIC) 7.5 MG tablet Take by mouth.     metformin (FORTAMET) 1000 MG (OSM) 24 hr tablet TAKE 1 TABLET BY MOUTH EVERY DAY WITH A MEAL *MC NOT CALLED*     vitamin B-12 (CYANOCOBALAMIN) 500 MCG tablet Take 500 mcg by mouth daily.     No current facility-administered medications for this visit.    Psychiatric Specialty Exam: Review of Systems  Cardiovascular:  Negative for chest pain.  Neurological:  Negative for tremors.  Psychiatric/Behavioral:  Negative for agitation and self-injury.     Blood pressure 128/84, pulse 84, height 6\' 5"  (1.956 m), weight (!) 304 lb (137.9 kg), SpO2 98 %.Body mass index is 36.05 kg/m.  General Appearance: Casual  Eye Contact:  Fair  Speech:  Clear and Coherent  Volume:  Decreased  Mood: fair  Affect:  Congruent  Thought Process:  Goal Directed  Orientation:  Full (Time, Place, and Person)  Thought Content:  Rumination  Suicidal Thoughts:  No  Homicidal Thoughts:  No  Memory:  Immediate;   Fair  Judgement:  Fair  Insight:  Shallow  Psychomotor Activity:  Decreased  Concentration:  Concentration: Fair  Recall:  of Knowledge:Fair  Language: Fair  Akathisia:   no involuntary movements  Handed:  Right  AIMS (if indicated):  no involuntary movements  Assets:  Desire for Improvement Housing Social Support  ADL's:  Intact  Cognition: WNL  Sleep:  Fair   Screenings: [employer] from 06/10/2022 in [facility] AT [place] Office Visit from 05/01/2022 in [facility] AT [place]  PHQ-2 Total Score 2 2  PHQ-9 Total Score 15 13      Flowsheet Row Office Visit from 09/11/2022 in [facility] AT [place] Office Visit from 06/12/2022 in [facility] AT [place] Counselor from 06/10/2022 in [facility] AT [place]  C-SSRS RISK  CATEGORY No Risk Error: Q3, 4, or 5 should not be populated when Q2 is No Error: Q3, 4, or 5 should not be populated when Q2 is No       Assessment and Plan: as follows Prior documentation reviewed  Bipolar disorder depressed episode:  manageble, continue latuda, lamictal, lithium  No rash  GAD: ffluctuates, working with therapist to deal with it, continue prn xanax if needed. Plans to see counsellor more for anxiety and coping with it.  Panic attacks: sporadic, not worse, xanax helps will continue  Meds due were renewed Direct care time spent in office including face to face  15 - 20 min,   06/12/2022, MD 10/26/20233:55 PM

## 2022-09-22 ENCOUNTER — Ambulatory Visit (HOSPITAL_COMMUNITY): Payer: Medicare Other | Admitting: Licensed Clinical Social Worker

## 2022-09-22 NOTE — Progress Notes (Signed)
Patient scheduled for in person session and did not show

## 2022-10-06 ENCOUNTER — Ambulatory Visit (HOSPITAL_COMMUNITY): Payer: Medicare Other | Admitting: Licensed Clinical Social Worker

## 2022-10-06 NOTE — Progress Notes (Signed)
Patient did not show for appointment.   

## 2022-10-23 ENCOUNTER — Other Ambulatory Visit (HOSPITAL_COMMUNITY): Payer: Self-pay | Admitting: Psychiatry

## 2022-11-08 ENCOUNTER — Other Ambulatory Visit (HOSPITAL_COMMUNITY): Payer: Self-pay | Admitting: Psychiatry

## 2022-12-17 ENCOUNTER — Other Ambulatory Visit (HOSPITAL_COMMUNITY): Payer: Self-pay | Admitting: Psychiatry

## 2023-01-13 ENCOUNTER — Encounter (HOSPITAL_COMMUNITY): Payer: Self-pay | Admitting: Psychiatry

## 2023-01-13 ENCOUNTER — Ambulatory Visit (INDEPENDENT_AMBULATORY_CARE_PROVIDER_SITE_OTHER): Payer: Medicare Other | Admitting: Psychiatry

## 2023-01-13 VITALS — BP 132/86 | HR 100 | Temp 98.2°F | Ht 73.0 in | Wt 308.0 lb

## 2023-01-13 DIAGNOSIS — F410 Panic disorder [episodic paroxysmal anxiety] without agoraphobia: Secondary | ICD-10-CM | POA: Diagnosis not present

## 2023-01-13 DIAGNOSIS — F3130 Bipolar disorder, current episode depressed, mild or moderate severity, unspecified: Secondary | ICD-10-CM

## 2023-01-13 DIAGNOSIS — F411 Generalized anxiety disorder: Secondary | ICD-10-CM | POA: Diagnosis not present

## 2023-01-13 MED ORDER — BUPROPION HCL ER (SR) 100 MG PO TB12: 100.0000 mg | ORAL_TABLET | Freq: Every day | ORAL | 1 refills | Status: DC

## 2023-01-13 MED ORDER — LURASIDONE HCL 40 MG PO TABS: ORAL_TABLET | ORAL | 1 refills | Status: DC

## 2023-01-13 MED ORDER — CLONIDINE HCL 0.1 MG PO TABS: 0.1000 mg | ORAL_TABLET | Freq: Every day | ORAL | 1 refills | Status: DC

## 2023-01-13 MED ORDER — LAMOTRIGINE 200 MG PO TABS: ORAL_TABLET | ORAL | 0 refills | Status: DC

## 2023-01-13 NOTE — Progress Notes (Signed)
[place] Follow up visit  Patient Identification: [name] MRN:  [id number] Date of Evaluation:  01/13/2023 Referral Source: primary care  Chief Complaint:   No chief complaint on file. Follow up mood symptoms, bipolar review Visit Diagnosis:    ICD-10-CM   1. Bipolar I disorder, most recent episode depressed ([place])  F31.30     2. Panic attacks  F41.0     3. GAD (generalized anxiety disorder)  F41.1       History of Present Illness:  32 years old referred by primary care, [facility], now due to insurance reason has to change provider, on disability for bipolar, on multiple other meds before or tried, including caplyta, vraylar  Patient is here with his dad today apparently has had depression over the Christmas holidays and winter has been having some hopelessness despair and loneliness feeling.  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  Worries are there he feels a motivated irregular sleep cycle dad also mentioned that he sometimes get moody and appears to be depressed at home Different medication withdrawal in the past and we reviewed some of them again he is taking Lamictal no rash  Aggravating factor:finances, world events, chronic mental health,  Modifying factor: parents, goes for a walk Severity subdued duration since young age No past psych admission or suicide attempt  Past Psychiatric History: depression, panic attacks,   Previous Psychotropic Medications: Yes  Caplyta, vraylar   Substance Abuse History in the last 12 months:  Yes.    Consequences of Substance Abuse: Takes beer over the weekend, discussed effects on depression and to abstain  Past Medical History: History reviewed. No pertinent past medical history. History reviewed. No pertinent surgical history.  Family Psychiatric History: GF brother, bipolar, sister possible  bipolar  Family History: History reviewed. No pertinent family history.  Social History:   Social History   Socioeconomic History   Marital status: Single    Spouse name: Not on file   Number of children: Not on file   Years of education: Not on file   Highest education level: Not on file  Occupational History   Not on file  Tobacco Use   Smoking status: Some Days    Types: Cigarettes, E-cigarettes   Smokeless tobacco: Never   Tobacco comments:    Reports vaps a few times a week and trying to quit  Vaping Use   Vaping Use: Some days   Substances: Nicotine, Flavoring  Substance and Sexual Activity   Alcohol use: Not Currently    Alcohol/week: 2.0 standard drinks of alcohol    Types: 2 Glasses of wine per week   Drug use: Never   Sexual activity: Yes  Other Topics Concern   Not on file  Social History Narrative   Not on file   Social Determinants of Health   Financial Resource Strain: Not on file  Food Insecurity: Not on file  Transportation Needs: Not on file  Physical Activity: Not on file  Stress: Not on file  Social Connections: Not on file    Additional Social History: grew up with parents, 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 On disability for bipolar  Allergies:  No Known Allergies  Metabolic Disorder Labs: No results found for: "HGBA1C", "MPG" No results found for: "PROLACTIN" No results found for: "CHOL", "TRIG", "HDL", "CHOLHDL", "VLDL", "LDLCALC"  Lab Results  Component Value Date   TSH 1.03 05/01/2022    Therapeutic Level Labs: Lab Results  Component Value Date   LITHIUM 0.5 (L) 05/01/2022   No results found for: "CBMZ" No results found for: "VALPROATE"  Current Medications: Current Outpatient Medications  Medication Sig Dispense Refill   albuterol (VENTOLIN HFA) 108 (90 Base) MCG/ACT inhaler SMARTSIG:1-2 Puff(s) By Mouth Every 6 Hours PRN     ALPRAZolam (XANAX) 0.5 MG tablet TAKE 1 TABLET BY MOUTH EVERY  DAY AS NEEDED FOR ANXIETY 30 tablet 0   buPROPion ER (WELLBUTRIN SR) 100 MG 12 hr tablet Take 1 tablet (100 mg total) by mouth daily. 30 tablet 1   lithium 300 MG tablet TAKE 3 TABLETS (900 MG TOTAL) BY MOUTH AT BEDTIME. 270 tablet 1   metformin (FORTAMET) 1000 MG (OSM) 24 hr tablet TAKE 1 TABLET BY MOUTH EVERY DAY WITH A MEAL *MC NOT CALLED*     vitamin B-12 (CYANOCOBALAMIN) 500 MCG tablet Take 500 mcg by mouth daily.     cloNIDine (CATAPRES) 0.1 MG tablet Take 1 tablet (0.1 mg total) by mouth daily. 30 tablet 1   lamoTRIgine (LAMICTAL) 200 MG tablet TAKE 1 ORAL TABLET ONCE A DAY (IF OUT OF LAMICTAL MORE THAN 7 DAYS DO NOT RESTART CALL MD) 90 tablet 0   lurasidone (LATUDA) 40 MG TABS tablet One a day 30 tablet 1   meloxicam (MOBIC) 7.5 MG tablet Take by mouth. (Patient not taking: Reported on 01/13/2023)     No current facility-administered medications for this visit.    Psychiatric Specialty Exam: Review of Systems  Cardiovascular:  Negative for chest pain.  Neurological:  Negative for tremors.  Psychiatric/Behavioral:  Positive for dysphoric mood. Negative for agitation and self-injury.     Blood pressure 132/86, pulse 100, temperature 98.2 F (36.8 C), height '6\' 1"'$  (1.854 m), weight (!) 308 lb (139.7 kg), SpO2 97 %.Body mass index is 40.64 kg/m.  General Appearance: Casual  Eye Contact:  Fair  Speech:  Clear and Coherent  Volume:  Decreased  Mood: fair  Affect:  Congruent  Thought Process:  Goal Directed  Orientation:  Full (Time, Place, and Person)  Thought Content:  Rumination  Suicidal Thoughts:  No  Homicidal Thoughts:  No  Memory:  Immediate;   Fair  Judgement:  Fair  Insight:  Shallow  Psychomotor Activity:  Decreased  Concentration:  Concentration: Fair  Recall:  [employer] of Knowledge:Fair  Language: Fair  Akathisia:   no involuntary movements  Handed:  Right  AIMS (if indicated):  no involuntary movements  Assets:  Desire for Improvement Housing Social  Support  ADL's:  Intact  Cognition: WNL  Sleep:  Fair   Screenings: 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 in [place] at [place] Visit from 05/01/2022 in [place] at [facility]  PHQ-2 Total Score 2 2  PHQ-9 Total Score 15 13      [place] Office Visit from 09/11/2022 in [place] at [place] Visit from 06/12/2022 in [place] at [place] from 06/10/2022 in [place] at [place] No Risk Error: Q3, 4, or 5 should not be populated when Q2 is No Error: Q3, 4, or 5 should not be populated when Q2 is No       Assessment and Plan: as follows  Prior documentation reviewed  Bipolar disorder depressed episode: Subdued continue [place], Lamictal we will add Wellbutrin small dose for depression GAD: Fluctuates more so on the downside continue Xanax as needed Reschedule therapy has not been in therapy for a couple of months Panic attacks: Sporadic continue Xanax  Talked about having a sleep study done since he snores and also wakes up tired he will connect with a primary care physician.  Discussed compliance with medications and also will add Wellbutrin and add activities during the day so that he can connect and also not sleep during the day to engage in activities to distract against negative thoughts Patient not suicidal Meds due were renewed Follow-up in 4 to 5 weeks or earlier if needed Direct care time spent in office including face to face 20+ minutes along with Merian Capron, MD 2/27/20243:23 PM

## 2023-02-04 ENCOUNTER — Other Ambulatory Visit (HOSPITAL_COMMUNITY): Payer: Self-pay | Admitting: Psychiatry

## 2023-02-17 ENCOUNTER — Ambulatory Visit (HOSPITAL_COMMUNITY): Payer: Medicare Other | Admitting: Psychiatry

## 2023-03-10 ENCOUNTER — Other Ambulatory Visit (HOSPITAL_COMMUNITY): Payer: Self-pay | Admitting: Psychiatry

## 2023-05-20 ENCOUNTER — Other Ambulatory Visit (HOSPITAL_COMMUNITY): Payer: Self-pay | Admitting: Psychiatry

## 2023-05-28 ENCOUNTER — Ambulatory Visit (HOSPITAL_COMMUNITY): Payer: Medicare Other | Admitting: Psychiatry

## 2023-06-01 ENCOUNTER — Other Ambulatory Visit (HOSPITAL_COMMUNITY): Payer: Self-pay | Admitting: Psychiatry

## 2023-06-09 ENCOUNTER — Encounter (HOSPITAL_COMMUNITY): Payer: Self-pay | Admitting: Psychiatry

## 2023-06-09 ENCOUNTER — Ambulatory Visit (INDEPENDENT_AMBULATORY_CARE_PROVIDER_SITE_OTHER): Payer: Medicare Other | Admitting: Psychiatry

## 2023-06-09 VITALS — BP 168/92 | HR 99 | Ht 73.0 in | Wt 302.0 lb

## 2023-06-09 DIAGNOSIS — F410 Panic disorder [episodic paroxysmal anxiety] without agoraphobia: Secondary | ICD-10-CM

## 2023-06-09 DIAGNOSIS — F3130 Bipolar disorder, current episode depressed, mild or moderate severity, unspecified: Secondary | ICD-10-CM | POA: Diagnosis not present

## 2023-06-09 DIAGNOSIS — F411 Generalized anxiety disorder: Secondary | ICD-10-CM

## 2023-06-09 MED ORDER — BUSPIRONE HCL 5 MG PO TABS: 5.0000 mg | ORAL_TABLET | Freq: Every day | ORAL | 0 refills | Status: DC | PRN

## 2023-06-09 MED ORDER — LURASIDONE HCL 40 MG PO TABS: ORAL_TABLET | ORAL | 1 refills | Status: AC

## 2023-06-09 MED ORDER — LITHIUM CARBONATE 300 MG PO TABS: 900.0000 mg | ORAL_TABLET | Freq: Every evening | ORAL | 1 refills | Status: AC

## 2023-06-09 MED ORDER — LAMOTRIGINE 200 MG PO TABS: ORAL_TABLET | ORAL | 0 refills | Status: AC

## 2023-06-09 MED ORDER — BUPROPION HCL ER (SR) 100 MG PO TB12: 100.0000 mg | ORAL_TABLET | Freq: Every day | ORAL | 0 refills | Status: AC

## 2023-06-09 NOTE — Progress Notes (Signed)
[facility] Follow up visit  Patient Identification: [name] MRN:  [id number] Date of Evaluation:  06/09/2023 Referral Source: primary care  Chief Complaint:   Chief Complaint  Patient presents with   Follow-up  Follow up mood symptoms, bipolar review Visit Diagnosis:    ICD-10-CM   1. Bipolar I disorder, most recent episode depressed ([facility])  F31.30 Lithium level    2. Panic attacks  F41.0     3. GAD (generalized anxiety disorder)  F41.1       History of Present Illness:  32 years old referred by primary care, [facility], now due to insurance reason has to change provider, on disability for bipolar, on multiple other meds before or tried, including caplyta, vraylar  Last visit was in winter, was feeling subdued. Wellbutrin was added He has not been seen since then . Had an accident in April and effected his right knee , using a cane and pending possible surgery  Overall states mood wise doing fair, dad is supportive Sleep is better and less irregular  No rash on lamictal  Aggravating factor:finances, world events, chronic mental health, recent accident Modifying factor: parents Severity fair despite current stressors No past psych admission or suicide attempt  Past Psychiatric History: depression, panic attacks,   Previous Psychotropic Medications: Yes  Caplyta, vraylar   Substance Abuse History in the last 12 months:  Yes.    Consequences of Substance Abuse: Takes beer over the weekend, discussed effects on depression and to abstain  Past Medical History: History reviewed. No pertinent past medical history. History reviewed. No pertinent surgical history.  Family Psychiatric History: GF brother, bipolar, sister possible bipolar  Family History: History reviewed. No pertinent family history.  Social History:   Social History   Socioeconomic History   Marital status: Single    Spouse name: Not on file    Number of children: Not on file   Years of education: Not on file   Highest education level: Not on file  Occupational History   Not on file  Tobacco Use   Smoking status: Some Days    Types: Cigarettes, E-cigarettes   Smokeless tobacco: Never   Tobacco comments:    Reports vaps a few times a week and trying to quit  Vaping Use   Vaping status: Some Days   Substances: Nicotine, Flavoring  Substance and Sexual Activity   Alcohol use: Not Currently    Alcohol/week: 2.0 standard drinks of alcohol    Types: 2 Glasses of wine per week   Drug use: Never   Sexual activity: Yes  Other Topics Concern   Not on file  Social History Narrative   Not on file   Social Determinants of Health   Financial Resource Strain: Not on file  Food Insecurity: Not on file  Transportation Needs: Not on file  Physical Activity: Not on file  Stress: Not on file  Social Connections: Unknown (03/16/2023)   [facility]   Social Network    Social Network: Not on file    Additional Social History: grew up with parents, 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 On disability for bipolar  Allergies:  No Known Allergies  Metabolic Disorder Labs: No results found for: "HGBA1C", "MPG" No results found for: "PROLACTIN" No results found for: "CHOL", "TRIG", "HDL", "CHOLHDL", "VLDL", "LDLCALC" Lab Results  Component Value Date   TSH 1.03 05/01/2022  Therapeutic Level Labs: Lab Results  Component Value Date   LITHIUM 0.5 (L) 05/01/2022   No results found for: "CBMZ" No results found for: "VALPROATE"  Current Medications: Current Outpatient Medications  Medication Sig Dispense Refill   albuterol (VENTOLIN HFA) 108 (90 Base) MCG/ACT inhaler SMARTSIG:1-2 Puff(s) By Mouth Every 6 Hours PRN     ALPRAZolam (XANAX) 0.5 MG tablet TAKE 1 TABLET BY MOUTH EVERY DAY AS NEEDED FOR ANXIETY 30 tablet 0   cloNIDine (CATAPRES) 0.1 MG tablet TAKE 1 TABLET  BY MOUTH EVERY DAY 90 tablet 0   meloxicam (MOBIC) 7.5 MG tablet Take by mouth.     metformin (FORTAMET) 1000 MG (OSM) 24 hr tablet TAKE 1 TABLET BY MOUTH EVERY DAY WITH A MEAL *MC NOT CALLED*     vitamin B-12 (CYANOCOBALAMIN) 500 MCG tablet Take 500 mcg by mouth daily.     buPROPion ER (WELLBUTRIN SR) 100 MG 12 hr tablet Take 1 tablet (100 mg total) by mouth daily. 90 tablet 0   lamoTRIgine (LAMICTAL) 200 MG tablet TAKE 1 ORAL TABLET ONCE A DAY (IF OUT OF LAMICTAL MORE THAN 7 DAYS DO NOT RESTART CALL MD) 90 tablet 0   lithium 300 MG tablet Take 3 tablets (900 mg total) by mouth at bedtime. 270 tablet 1   lurasidone (LATUDA) 40 MG TABS tablet One a day 30 tablet 1   No current facility-administered medications for this visit.    Psychiatric Specialty Exam: Review of Systems  Cardiovascular:  Negative for chest pain.  Neurological:  Negative for tremors.  Psychiatric/Behavioral:  Negative for agitation and self-injury.     Blood pressure (!) 168/92, pulse 99, height 6\' 1"  (1.854 m), weight (!) 302 lb (137 kg).Body mass index is 39.84 kg/m.  General Appearance: Casual  Eye Contact:  Fair  Speech:  Clear and Coherent  Volume:  Decreased  Mood:  fair  Affect:  Congruent  Thought Process:  Goal Directed  Orientation:  Full (Time, Place, and Person)  Thought Content:  Rumination  Suicidal Thoughts:  No  Homicidal Thoughts:  No  Memory:  Immediate;   Fair  Judgement:  Fair  Insight:  Shallow  Psychomotor Activity:  Decreased  Concentration:  Concentration: Fair  Recall:  [employer] of Knowledge:Fair  Language: Fair  Akathisia:   no involuntary movements  Handed:  Right  AIMS (if indicated):  no involuntary movements  Assets:  Desire for Improvement Housing Social Support  ADL's:  Intact  Cognition: WNL  Sleep:  Fair   Screenings: Insurance account manager from 06/10/2022 in [place] [facility] at [facility] Office Visit from  05/01/2022 in [facility] at [facility]  PHQ-2 Total Score 2 2  PHQ-9 Total Score 15 13      Flowsheet Row Office Visit from 09/11/2022 in [place] [facility] at [facility] Office Visit from 06/12/2022 in [facility] at [facility] Counselor from 06/10/2022 in [facility] at [facility]  C-SSRS RISK CATEGORY No Risk Error: Q3, 4, or 5 should not be populated when Q2 is No Error: Q3, 4, or 5 should not be populated when Q2 is No       Assessment and Plan: as follows  Prior documentation reviewed  Bipolar disorder depressed episode: manageable continue lithium Will write for levels, continue lamictal GAD: fluctuates, will add buspar for anxiety small dose so not to rely on xanax  and its effect discussed  Reschedule therapy has not been in therapy for a couple of months Panic attacks: sporadic, use xanax prn if needed  Fu 6 weeks or earlier if needed Direct care time spent in office including face to face  20 minutes  including chart review, documentation  Thresa Ross, MD 7/23/20242:54 PM

## 2023-07-01 ENCOUNTER — Other Ambulatory Visit (HOSPITAL_COMMUNITY): Payer: Self-pay | Admitting: Psychiatry

## 2023-07-28 ENCOUNTER — Telehealth (HOSPITAL_COMMUNITY): Payer: Self-pay | Admitting: Psychiatry

## 2023-07-28 ENCOUNTER — Ambulatory Visit (HOSPITAL_COMMUNITY): Payer: Medicare Other | Admitting: Psychiatry

## 2023-07-28 NOTE — Telephone Encounter (Signed)
Provider out of office. Left voicemail to notify and requested call back to reschedule.
